# Patient Record
Sex: Female | Born: 1975 | Race: White | Hispanic: No | Smoking: Current every day smoker
Health system: Southern US, Community
[De-identification: ages and names within clinical notes are randomized; demographics above are authoritative.]

## PROBLEM LIST (undated history)

## (undated) DIAGNOSIS — Z91018 Allergy to other foods: Secondary | ICD-10-CM

---

## 2010-05-12 ENCOUNTER — Ambulatory Visit: Payer: Self-pay | Admitting: Family Medicine

## 2011-12-21 ENCOUNTER — Ambulatory Visit: Payer: Self-pay | Admitting: General Practice

## 2012-08-10 ENCOUNTER — Ambulatory Visit: Payer: Self-pay | Admitting: Specialist

## 2012-08-10 LAB — CBC WITH DIFFERENTIAL/PLATELET
Basophil #: 0 10*3/uL (ref 0.0–0.1)
Basophil %: 0.6 %
Eosinophil #: 0.2 10*3/uL (ref 0.0–0.7)
Eosinophil %: 2 %
HCT: 39.2 % (ref 35.0–47.0)
HGB: 13.3 g/dL (ref 12.0–16.0)
Lymphocyte #: 1.6 10*3/uL (ref 1.0–3.6)
Lymphocyte %: 21.3 %
MCH: 30 pg (ref 26.0–34.0)
MCHC: 34 g/dL (ref 32.0–36.0)
MCV: 88 fL (ref 80–100)
Monocyte #: 0.5 x10 3/mm (ref 0.2–0.9)
Monocyte %: 6.3 %
Neutrophil #: 5.4 10*3/uL (ref 1.4–6.5)
Neutrophil %: 69.8 %
Platelet: 299 10*3/uL (ref 150–440)
RBC: 4.43 10*6/uL (ref 3.80–5.20)
RDW: 13.3 % (ref 11.5–14.5)
WBC: 7.7 10*3/uL (ref 3.6–11.0)

## 2012-08-10 LAB — COMPREHENSIVE METABOLIC PANEL
Albumin: 3.1 g/dL — ABNORMAL LOW (ref 3.4–5.0)
Alkaline Phosphatase: 86 U/L (ref 50–136)
Anion Gap: 3 — ABNORMAL LOW (ref 7–16)
BUN: 14 mg/dL (ref 7–18)
Bilirubin,Total: 0.3 mg/dL (ref 0.2–1.0)
Calcium, Total: 8.6 mg/dL (ref 8.5–10.1)
Chloride: 106 mmol/L (ref 98–107)
Co2: 29 mmol/L (ref 21–32)
Creatinine: 0.61 mg/dL (ref 0.60–1.30)
EGFR (African American): >60
EGFR (Non-African Amer.): >60
Glucose: 86 mg/dL (ref 65–99)
Osmolality: 275 (ref 275–301)
Potassium: 4.2 mmol/L (ref 3.5–5.1)
SGOT(AST): 18 U/L (ref 15–37)
SGPT (ALT): 17 U/L (ref 12–78)
Sodium: 138 mmol/L (ref 136–145)
Total Protein: 7.2 g/dL (ref 6.4–8.2)

## 2012-08-10 LAB — IRON AND TIBC
Iron Bind.Cap.(Total): 324 ug/dL (ref 250–450)
Iron Saturation: 21 %
Iron: 69 ug/dL (ref 50–170)
Unbound Iron-Bind.Cap.: 255 ug/dL

## 2012-08-10 LAB — LIPASE, BLOOD: Lipase: 145 U/L (ref 73–393)

## 2012-08-10 LAB — HEMOGLOBIN A1C: Hemoglobin A1C: 5.5 % (ref 4.2–6.3)

## 2012-08-10 LAB — TSH: Thyroid Stimulating Horm: 2.64 u[IU]/mL

## 2012-08-10 LAB — FERRITIN: Ferritin (ARMC): 65 ng/mL (ref 8–388)

## 2012-08-10 LAB — FOLATE: Folic Acid: 17.7 ng/mL (ref 3.1–100.0)

## 2012-08-10 LAB — PHOSPHORUS: Phosphorus: 2.7 mg/dL (ref 2.5–4.9)

## 2012-08-10 LAB — PROTIME-INR
INR: 0.9
Prothrombin Time: 12.8 secs (ref 11.5–14.7)

## 2012-08-10 LAB — AMYLASE: Amylase: 42 U/L (ref 25–115)

## 2012-08-10 LAB — BILIRUBIN, DIRECT: Bilirubin, Direct: <0.1 mg/dL (ref 0.00–0.20)

## 2012-08-10 LAB — MAGNESIUM: Magnesium: 1.4 mg/dL — ABNORMAL LOW

## 2012-08-16 ENCOUNTER — Ambulatory Visit: Payer: Self-pay | Admitting: Specialist

## 2012-09-04 ENCOUNTER — Ambulatory Visit: Payer: Self-pay | Admitting: Specialist

## 2013-03-07 HISTORY — PX: LAPAROSCOPIC GASTRIC SLEEVE RESECTION: SHX5895

## 2013-06-13 DIAGNOSIS — K219 Gastro-esophageal reflux disease without esophagitis: Secondary | ICD-10-CM | POA: Insufficient documentation

## 2014-08-04 ENCOUNTER — Emergency Department
Admission: EM | Admit: 2014-08-04 | Discharge: 2014-08-04 | Disposition: A | Discharge status: Home / Self Care | Payer: BLUE CROSS/BLUE SHIELD | Attending: Emergency Medicine | Admitting: Emergency Medicine

## 2014-08-04 ENCOUNTER — Encounter: Payer: Self-pay | Admitting: Emergency Medicine

## 2014-08-04 DIAGNOSIS — Y9289 Other specified places as the place of occurrence of the external cause: Secondary | ICD-10-CM | POA: Diagnosis not present

## 2014-08-04 DIAGNOSIS — Y9389 Activity, other specified: Secondary | ICD-10-CM | POA: Insufficient documentation

## 2014-08-04 DIAGNOSIS — X58XXXA Exposure to other specified factors, initial encounter: Secondary | ICD-10-CM | POA: Diagnosis not present

## 2014-08-04 DIAGNOSIS — L02415 Cutaneous abscess of right lower limb: Secondary | ICD-10-CM | POA: Diagnosis present

## 2014-08-04 DIAGNOSIS — Y998 Other external cause status: Secondary | ICD-10-CM | POA: Diagnosis not present

## 2014-08-04 DIAGNOSIS — T63301A Toxic effect of unspecified spider venom, accidental (unintentional), initial encounter: Secondary | ICD-10-CM | POA: Insufficient documentation

## 2014-08-04 DIAGNOSIS — Z87891 Personal history of nicotine dependence: Secondary | ICD-10-CM | POA: Diagnosis not present

## 2014-08-04 MED ORDER — SULFAMETHOXAZOLE-TRIMETHOPRIM 800-160 MG PO TABS: 1.0000 | ORAL_TABLET | Freq: Two times a day (BID) | ORAL | Status: DC

## 2014-08-04 MED ORDER — IBUPROFEN 800 MG PO TABS: 800.0000 mg | ORAL_TABLET | Freq: Three times a day (TID) | ORAL | Status: DC | PRN

## 2014-08-04 NOTE — ED Provider Notes (Signed)
[facility] Emergency Department Provider Note  ____________________________________________  Time seen: Approximately 10:32 AM  I have reviewed the triage vital signs and the nursing notes.   HISTORY  Chief Complaint Abscess   HPI Monique Hill is a 39 y.o. female who presents to the emergency room with complaints of a possible spider bite to her right upper thigh. States symptoms started 2 days ago with increased pain and bruising around the site. Denies any other trauma.  No past medical history on file.  There are no active problems to display for this patient.   No past surgical history on file.  Current Outpatient Rx  Name  Route  Sig  Dispense  Refill  . ibuprofen (ADVIL,MOTRIN) 800 MG tablet   Oral   Take 1 tablet (800 mg total) by mouth every 8 (eight) hours as needed.   30 tablet   0   . sulfamethoxazole-trimethoprim (BACTRIM DS,SEPTRA DS) 800-160 MG per tablet   Oral   Take 1 tablet by mouth 2 (two) times daily.   20 tablet   0     Allergies Review of patient's allergies indicates no known allergies.  No family history on file.  Social History History  Substance Use Topics  . Smoking status: Former Games developermoker  . Smokeless tobacco: Not on file  . Alcohol Use: Yes    Review of Systems Constitutional: No fever/chills Eyes: No visual changes. ENT: No sore throat. Cardiovascular: Denies chest pain. Respiratory: Denies shortness of breath. Gastrointestinal: No abdominal pain.  No nausea, no vomiting.  No diarrhea.  No constipation. Genitourinary: Negative for dysuria. Musculoskeletal: Negative for back pain. Skin: Negative for rash. Positive for insect bite Neurological: Negative for headaches, focal weakness or numbness.  10-point ROS otherwise negative.  ____________________________________________   PHYSICAL EXAM:  VITAL SIGNS: ED Triage Vitals  Enc Vitals Group     BP 08/04/14 0841 124/85 mmHg     Pulse Rate  08/04/14 0841 98     Resp 08/04/14 0841 18     Temp 08/04/14 0841 98.7 F (37.1 C)     Temp Source 08/04/14 0841 Oral     SpO2 08/04/14 0841 96 %     Weight 08/04/14 0841 200 lb (90.719 kg)     Height 08/04/14 0841 5\' 5"  (1.651 m)     Head Cir --      Peak Flow --      Pain Score 08/04/14 0838 0     Pain Loc --      Pain Edu? --      Excl. in GC? --     Constitutional: Alert and oriented. Well appearing and in no acute distress. Eyes: Conjunctivae are normal. PERRL. EOMI. Head: Atraumatic. Nose: No congestion/rhinnorhea. Mouth/Throat: Mucous membranes are moist.  Oropharynx non-erythematous. Neck: No stridor.   Cardiovascular: Normal rate, regular rhythm. Grossly normal heart sounds.  Good peripheral circulation. Respiratory: Normal respiratory effort.  No retractions. Lungs CTAB. Gastrointestinal: Soft and nontender. No distention. No abdominal bruits. No CVA tenderness. Musculoskeletal: No lower extremity tenderness nor edema.  No joint effusions. Neurologic:  Normal speech and language. No gross focal neurologic deficits are appreciated. Speech is normal. No gait instability. Skin:  Skin is warm, dry and intact. No rash noted. Positive for 6-8 cm ecchymosis around the central punctate bite. Psychiatric: Mood and affect are normal. Speech and behavior are normal.  ____________________________________________   LABS (all labs ordered are listed, but only abnormal results are displayed)  Labs Reviewed -  No data to display ____________________________________________  EKG  Not applicable ____________________________________________  RADIOLOGY  Not applicable ____________________________________________   PROCEDURES  Procedure(s) performed: None  Critical Care performed: No  ____________________________________________   INITIAL IMPRESSION / ASSESSMENT AND PLAN / ED COURSE  Pertinent labs & imaging results that were available during my care of the patient  were reviewed by me and considered in my medical decision making (see chart for details).  Spider bite/insect bite of unknown origin. Patient understands treatment plan will treat with Bactrim DS twice a day and Motrin 800. Eyes compresses to the area elevate today. Patient understands a bruising will take several weeks to resolve. No other emergency medical complaints at this time ____________________________________________   FINAL CLINICAL IMPRESSION(S) / ED DIAGNOSES  Final diagnoses:  Spider bite, accidental or unintentional, initial encounter      Evangeline Dakin, PA-C 08/04/14 1045  Phineas Semen, MD 08/04/14 1124

## 2014-08-04 NOTE — Discharge Instructions (Signed)
Spider Bite Spider bites are not common. Most spider bites do not cause serious problems. The elderly, very young children, and people with certain existing medical conditions are more likely to experience significant symptoms. SYMPTOMS  Spider bites may not cause any pain at first. Within 1 or 2 days of the bite, there may be swelling, redness, and pain in the bite area. However, some spider bites can cause pain within the first hour. TREATMENT  Your caregiver may prescribe antibiotic medicine if a bacterial infection develops in the bite. However, not all spider bites require antibiotics or prescription medicines.  HOME CARE INSTRUCTIONS  Do not scratch the bite area.  Keep the bite area clean and dry. Wash the area with soap and water as directed.  Put ice or cool compresses on the bite area.  Put ice in a plastic bag.  Place a towel between your skin and the bag.  Leave the ice on for 20 minutes, 4 times a day for the first 2 to 3 days, or as directed.  Keep the bite area elevated above the level of your heart. This helps reduce redness and swelling.  Only take over-the-counter or prescription medicines as directed by your caregiver.  If you are given antibiotics, take them as directed. Finish them even if you start to feel better. You may need a tetanus shot if:  You cannot remember when you had your last tetanus shot.  You have never had a tetanus shot.  The injury broke your skin. If you get a tetanus shot, your arm may swell, get red, and feel warm to the touch. This is common and not a problem. If you need a tetanus shot and you choose not to have one, there is a rare chance of getting tetanus. Sickness from tetanus can be serious. SEEK MEDICAL CARE IF: Your bite is not better after 3 days of treatment. SEEK IMMEDIATE MEDICAL CARE IF:  Your bite turns purple or develops increased swelling, pain, or redness.  You develop shortness of breath or chest pain.  You have  muscle cramps or painful muscle spasms.  You develop abdominal pain, nausea, or vomiting.  You feel unusually tired or sleepy. MAKE SURE YOU:  Understand these instructions.  Will watch your condition.  Will get help right away if you are not doing well or get worse. Document Released: 03/31/2004 Document Revised: 05/16/2011 Document Reviewed: 09/22/2010 ExitCare Patient Information 2015 [employer]. This information is not intended to replace advice given to you by your health care provider. Make sure you discuss any questions you have with your health care provider.  

## 2014-08-04 NOTE — ED Notes (Signed)
C/o abscess to left upper leg since Saturday

## 2017-03-15 DIAGNOSIS — M1712 Unilateral primary osteoarthritis, left knee: Secondary | ICD-10-CM | POA: Insufficient documentation

## 2017-06-22 ENCOUNTER — Emergency Department: Payer: BLUE CROSS/BLUE SHIELD

## 2017-06-22 ENCOUNTER — Emergency Department
Admission: EM | Admit: 2017-06-22 | Discharge: 2017-06-22 | Disposition: A | Discharge status: Home / Self Care | Payer: BLUE CROSS/BLUE SHIELD | Attending: Emergency Medicine | Admitting: Emergency Medicine

## 2017-06-22 ENCOUNTER — Other Ambulatory Visit: Payer: Self-pay

## 2017-06-22 DIAGNOSIS — Z79899 Other long term (current) drug therapy: Secondary | ICD-10-CM | POA: Diagnosis not present

## 2017-06-22 DIAGNOSIS — W108XXA Fall (on) (from) other stairs and steps, initial encounter: Secondary | ICD-10-CM | POA: Insufficient documentation

## 2017-06-22 DIAGNOSIS — Z87891 Personal history of nicotine dependence: Secondary | ICD-10-CM | POA: Insufficient documentation

## 2017-06-22 DIAGNOSIS — M545 Low back pain, unspecified: Secondary | ICD-10-CM

## 2017-06-22 MED ORDER — CYCLOBENZAPRINE HCL 10 MG PO TABS
5.0000 mg | ORAL_TABLET | Freq: Once | ORAL | Status: AC
  Administered 2017-06-22: 5 mg via ORAL
  Filled 2017-06-22: qty 1

## 2017-06-22 MED ORDER — CYCLOBENZAPRINE HCL 10 MG PO TABS: 10.0000 mg | ORAL_TABLET | Freq: Three times a day (TID) | ORAL | 0 refills | Status: DC | PRN

## 2017-06-22 MED ORDER — OXYCODONE-ACETAMINOPHEN 5-325 MG PO TABS
1.0000 | ORAL_TABLET | Freq: Once | ORAL | Status: AC
  Administered 2017-06-22: 1 via ORAL
  Filled 2017-06-22: qty 1

## 2017-06-22 MED ORDER — TRAMADOL HCL 50 MG PO TABS: 50.0000 mg | ORAL_TABLET | Freq: Four times a day (QID) | ORAL | 0 refills | Status: DC | PRN

## 2017-06-22 NOTE — ED Triage Notes (Signed)
Pt states she missed a step and fell landing on her right side today and is having mid to lower right sided back pain..Marland Kitchen

## 2017-06-22 NOTE — Discharge Instructions (Addendum)
Follow-up with your regular doctor or the acute care if not better in 5 to 7 days.  Use the medication as prescribed.  If you are worsening please return to the emergency department.

## 2017-06-22 NOTE — ED Notes (Signed)
Pt states that she was walking out of the house and missed one step and fell down. Pt states that she was holding on to the door and thinks that she twisted her back in the process. Pt states that the pain is a 4/10 while sitting in the chair but when she tries to move her back she becomes a 8/10. Pt states no LOC, no hitting head, and hitting back. Pt states that pain is in middle of her back and her muscles feel as if they are tensing up when she moves.

## 2017-06-22 NOTE — ED Provider Notes (Signed)
[facility] Emergency Department Provider Note  ____________________________________________   First MD Initiated Contact with Patient 06/22/17 1634     (approximate)  I have reviewed the triage vital signs and the nursing notes.   HISTORY  Chief Complaint Fall and Back Pain    HPI Monique Hill is a 42 y.o. female who presents the ER with her sister.  She states that she was walking around the house and missed one step and fell down.  Landed on concrete in the garage.  She states that she was holding onto the door thinks she twisted her back in the process.  She states that the pain is bearable when sitting in the chair but when she tries to move her back becomes extremely painful.  She denies any numbness or tingling going into her legs.  She denies any head injury.  She denies loss of consciousness.  No past medical history on file.  There are no active problems to display for this patient.     Prior to Admission medications   Medication Sig Start Date End Date Taking? Authorizing Provider  cyclobenzaprine (FLEXERIL) 10 MG tablet Take 1 tablet (10 mg total) by mouth 3 (three) times daily as needed for muscle spasms. 06/22/17   Demarques Pilz, Roselyn BeringSusan W, PA-C  ibuprofen (ADVIL,MOTRIN) 800 MG tablet Take 1 tablet (800 mg total) by mouth every 8 (eight) hours as needed. 08/04/14   Beers, Charmayne Sheerharles M, PA-C  sulfamethoxazole-trimethoprim (BACTRIM DS,SEPTRA DS) 800-160 MG per tablet Take 1 tablet by mouth 2 (two) times daily. 08/04/14   Beers, Charmayne Sheerharles M, PA-C  traMADol (ULTRAM) 50 MG tablet Take 1 tablet (50 mg total) by mouth every 6 (six) hours as needed. 06/22/17   Faythe GheeFisher, Lilliana Turner W, PA-C    Allergies Patient has no known allergies.  No family history on file.  Social History Social History   Tobacco Use  . Smoking status: Former Smoker  Substance Use Topics  . Alcohol use: Yes  . Drug use: Not on file    Review of Systems  Constitutional: No  fever/chills Eyes: No visual changes. ENT: No sore throat. Respiratory: Denies cough Genitourinary: Negative for dysuria. Musculoskeletal: Positive for back pain. Skin: Negative for rash.    ____________________________________________   PHYSICAL EXAM:  VITAL SIGNS: ED Triage Vitals  Enc Vitals Group     BP 06/22/17 1634 (!) 128/92     Pulse Rate 06/22/17 1634 98     Resp 06/22/17 1634 17     Temp 06/22/17 1634 98.6 F (37 C)     Temp Source 06/22/17 1634 Oral     SpO2 06/22/17 1634 98 %     Weight 06/22/17 1634 247 lb (112 kg)     Height 06/22/17 1634 5\' 5"  (1.651 m)     Head Circumference --      Peak Flow --      Pain Score 06/22/17 1630 8     Pain Loc --      Pain Edu? --      Excl. in GC? --     Constitutional: Alert and oriented. Well appearing and in no acute distress. Eyes: Conjunctivae are normal.  Head: Atraumatic. Nose: No congestion/rhinnorhea. Mouth/Throat: Mucous membranes are moist.   Cardiovascular: Normal rate, regular rhythm.  Heart sounds are normal Respiratory: Normal respiratory effort.  No retractions, lungs clear to all station GU: deferred Musculoskeletal: Patient is bracing herself at the wheelchair.  She is tender in the lumbar spine.  She  is neurovascularly intact.  She is able to lift her great toes without difficulty. Neurologic:  Normal speech and language.  Skin:  Skin is warm, dry and intact. No rash noted. Psychiatric: Mood and affect are normal. Speech and behavior are normal.  ____________________________________________   LABS (all labs ordered are listed, but only abnormal results are displayed)  Labs Reviewed - No data to display ____________________________________________   ____________________________________________  RADIOLOGY  X-ray of the lumbar spine is negative for any acute abnormality  ____________________________________________   PROCEDURES  Procedure(s) performed: Patient was given Percocet and  Flexeril p.o.  Procedures    ____________________________________________   INITIAL IMPRESSION / ASSESSMENT AND PLAN / ED COURSE  Pertinent labs & imaging results that were available during my care of the patient were reviewed by me and considered in my medical decision making (see chart for details).  Patient is a 42 year old female presents emergency department after falling down one step into the garage.  She landed on concrete.  She is complaining of back pain.  She states that she twisted her back more than falling directly on it.  On physical exam she is bracing herself in the chair.  She appears to be very uncomfortable.  The lumbar spine is tender to palpation.  X-ray lumbar spine was ordered.  Patient was given Percocet p.o. and Flexeril p.o.  X-ray of the lumbar spine was negative.  X-ray results were discussed with the patient.  She states that she does feel much better after the medication.  She was given a prescription for Flexeril and tramadol.  She is to follow-up with her regular doctor if not better in 3 to 5 days.  She is to apply ice to the area as much as possible.  She states she understands will comply with our instructions.  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  She was discharged in stable condition     As part of my medical decision making, I reviewed the following data within the electronic MEDICAL RECORD NUMBER Nursing notes reviewed and incorporated, Old chart reviewed, Radiograph reviewed x-ray lumbar spine is negative, Notes from prior ED visits and [place] Controlled Substance Database  ____________________________________________   FINAL CLINICAL IMPRESSION(S) / ED DIAGNOSES  Final diagnoses:  Acute midline low back pain without sciatica  Fall (on) (from) other stairs and steps, initial encounter      NEW MEDICATIONS STARTED DURING THIS VISIT:  Discharge Medication List as of 06/22/2017  5:54 PM    START taking these  medications   Details  cyclobenzaprine (FLEXERIL) 10 MG tablet Take 1 tablet (10 mg total) by mouth 3 (three) times daily as needed for muscle spasms., Starting Thu 06/22/2017, Print    traMADol (ULTRAM) 50 MG tablet Take 1 tablet (50 mg total) by mouth every 6 (six) hours as needed., Starting Thu 06/22/2017, Print         Note:  This document was prepared using Dragon voice recognition software and may include unintentional dictation errors.    Faythe Ghee, PA-C 06/22/17 2249    Minna Antis, MD 06/22/17 2250

## 2018-05-06 DIAGNOSIS — Z91018 Allergy to other foods: Secondary | ICD-10-CM | POA: Insufficient documentation

## 2018-05-06 DIAGNOSIS — E538 Deficiency of other specified B group vitamins: Secondary | ICD-10-CM | POA: Insufficient documentation

## 2018-05-06 DIAGNOSIS — E559 Vitamin D deficiency, unspecified: Secondary | ICD-10-CM | POA: Insufficient documentation

## 2020-07-06 ENCOUNTER — Other Ambulatory Visit: Payer: Self-pay

## 2020-07-06 ENCOUNTER — Ambulatory Visit
Admission: EM | Admit: 2020-07-06 | Discharge: 2020-07-06 | Disposition: A | Discharge status: Home / Self Care | Payer: BC Managed Care – PPO | Attending: Family Medicine | Admitting: Family Medicine

## 2020-07-06 DIAGNOSIS — R21 Rash and other nonspecific skin eruption: Secondary | ICD-10-CM | POA: Diagnosis not present

## 2020-07-06 HISTORY — DX: Allergy to other foods: Z91.018

## 2020-07-06 MED ORDER — PREDNISONE 10 MG PO TABS: ORAL_TABLET | ORAL | 0 refills | Status: AC

## 2020-07-06 MED ORDER — CETIRIZINE HCL 10 MG PO TABS: 10.0000 mg | ORAL_TABLET | Freq: Every day | ORAL | 1 refills | Status: AC

## 2020-07-06 MED ORDER — HYDROXYZINE HCL 25 MG PO TABS: 25.0000 mg | ORAL_TABLET | Freq: Three times a day (TID) | ORAL | 0 refills | Status: AC | PRN

## 2020-07-06 NOTE — ED Triage Notes (Signed)
Patient states that she has been having a rash since yesterday morning. States that this happened after wearing a nickel necklace. States that rash was minimal but when she woke up this morning it was covering entire face, down right side of neck and on her chest. States that the rash itches and burns and feels tight.

## 2020-07-06 NOTE — Discharge Instructions (Signed)
Medication as prescribed.  If persists, contact dermatology. I recommend [facility] dermatology.  Take care  Dr. Adriana Simas

## 2020-07-06 NOTE — ED Provider Notes (Signed)
[facility]    CSN: [id number] Arrival date & time: 07/06/20  0920      History   Chief Complaint Chief Complaint  Patient presents with  . Rash   HPI  45 year old female presents with the above complaint.  Patient reports that she developed a rash yesterday.  It has now worsened.  She states that the only possible inciting factor that she can think of was wearing a new necklace.  She has rash on the face, neck.  She states that it is extending down to the chest.  The rash itches and burns.  No relieving factors.  No other complaints.  Past Medical History:  Diagnosis Date  . Allergy to alpha-gal    Past Surgical History:  Procedure Laterality Date  . LAPAROSCOPIC GASTRIC SLEEVE RESECTION  2015    OB History   No obstetric history on file.      Home Medications    Prior to Admission medications   Medication Sig Start Date End Date Taking? Authorizing Provider  cetirizine (ZYRTEC ALLERGY) 10 MG tablet Take 1 tablet (10 mg total) by mouth daily. 07/06/20  Yes Ariyannah Pauling G, DO  hydrOXYzine (ATARAX/VISTARIL) 25 MG tablet Take 1 tablet (25 mg total) by mouth every 8 (eight) hours as needed for itching. 07/06/20  Yes Jomayra Novitsky G, DO  omeprazole (PRILOSEC) 40 MG capsule Take 40 mg by mouth daily.   Yes [provider]  predniSONE (DELTASONE) 10 MG tablet 50 mg daily x 2 days, then 40 mg daily x 2 days, then 30 mg daily x 2 days, then 20 mg daily x 2 days, then 10 mg daily x 2 days. 07/06/20  Yes Abelardo Seidner G, DO  sertraline (ZOLOFT) 50 MG tablet Take 1 tablet by mouth daily. 06/24/20   [provider]    Family History Family History  Problem Relation Age of Onset  . Healthy Mother   . Lymphoma Father     Social History Social History   Tobacco Use  . Smoking status: Current Every Day Smoker    Packs/day: 0.50    Types: Cigarettes  . Smokeless tobacco: Never Used  Vaping Use  . Vaping Use: Never used  Substance Use Topics  . Alcohol  use: Yes    Comment: occasionally  . Drug use: Never     Allergies   Alpha-gal and Gluten meal   Review of Systems Review of Systems  Constitutional: Negative.   Skin:       Rash and associated itching.   Physical Exam Triage Vital Signs ED Triage Vitals  Enc Vitals Group     BP 07/06/20 0941 (!) 125/96     Pulse Rate 07/06/20 0941 67     Resp 07/06/20 0941 18     Temp 07/06/20 0941 98.2 F (36.8 C)     Temp Source 07/06/20 0941 Oral     SpO2 07/06/20 0941 97 %     Weight 07/06/20 0937 225 lb (102.1 kg)     Height 07/06/20 0937 5\' 5"  (1.651 m)     Head Circumference --      Peak Flow --      Pain Score 07/06/20 0936 0     Pain Loc --      Pain Edu? --      Excl. in GC? --    No data found.  Updated Vital Signs BP (!) 125/96 (BP Location: Right Arm)   Pulse 67   Temp 98.2  F (36.8 C) (Oral)   Resp 18   Ht 5\' 5"  (1.651 m)   Wt 102.1 kg   LMP 07/06/2020   SpO2 97%   BMI 37.44 kg/m   Visual Acuity Right Eye Distance:   Left Eye Distance:   Bilateral Distance:    Right Eye Near:   Left Eye Near:    Bilateral Near:     Physical Exam Constitutional:      General: She is not in acute distress.    Appearance: She is not ill-appearing.  HENT:     Head: Normocephalic and atraumatic.  Eyes:     General:        Right eye: No discharge.        Left eye: No discharge.     Conjunctiva/sclera: Conjunctivae normal.  Pulmonary:     Effort: Pulmonary effort is normal. No respiratory distress.  Skin:    Comments: Raised, erythematous rash noted on the face, neck.  Neurological:     Mental Status: She is alert.  Psychiatric:        Mood and Affect: Mood normal.        Behavior: Behavior normal.    UC Treatments / Results  Labs (all labs ordered are listed, but only abnormal results are displayed) Labs Reviewed - No data to display  EKG   Radiology No results found.  Procedures Procedures (including critical care time)  Medications Ordered in  UC Medications - No data to display  Initial Impression / Assessment and Plan / UC Course  I have reviewed the triage vital signs and the nursing notes.  Pertinent labs & imaging results that were available during my care of the patient were reviewed by me and considered in my medical decision making (see chart for details).    45 year old female presents with rash.  Appears to be contact or allergic in nature.  Placing on prednisone, Zyrtec, Atarax.  Final Clinical Impressions(s) / UC Diagnoses   Final diagnoses:  Rash     Discharge Instructions     Medication as prescribed.  If persists, contact dermatology. I recommend [facility] dermatology.  Take care  Dr. [facility]    ED Prescriptions    Medication Sig Dispense Auth. Provider   predniSONE (DELTASONE) 10 MG tablet 50 mg daily x 2 days, then 40 mg daily x 2 days, then 30 mg daily x 2 days, then 20 mg daily x 2 days, then 10 mg daily x 2 days. 30 tablet Devika Dragovich G, DO   cetirizine (ZYRTEC ALLERGY) 10 MG tablet Take 1 tablet (10 mg total) by mouth daily. 30 tablet Quention Mcneill G, DO   hydrOXYzine (ATARAX/VISTARIL) 25 MG tablet Take 1 tablet (25 mg total) by mouth every 8 (eight) hours as needed for itching. 30 tablet 04-11-2003, DO     PDMP not reviewed this encounter.   Tommie Sams, Tommie Sams 07/06/20 1025

## 2020-07-28 ENCOUNTER — Other Ambulatory Visit: Payer: Self-pay | Admitting: Family Medicine

## 2021-05-27 ENCOUNTER — Other Ambulatory Visit: Payer: Self-pay | Admitting: Certified Nurse Midwife

## 2021-05-27 DIAGNOSIS — Z1231 Encounter for screening mammogram for malignant neoplasm of breast: Secondary | ICD-10-CM

## 2021-07-01 ENCOUNTER — Ambulatory Visit (INDEPENDENT_AMBULATORY_CARE_PROVIDER_SITE_OTHER): Payer: BC Managed Care – PPO

## 2021-07-01 ENCOUNTER — Ambulatory Visit: Payer: BC Managed Care – PPO | Admitting: Podiatry

## 2021-07-01 DIAGNOSIS — M722 Plantar fascial fibromatosis: Secondary | ICD-10-CM

## 2021-07-01 DIAGNOSIS — E669 Obesity, unspecified: Secondary | ICD-10-CM | POA: Insufficient documentation

## 2021-07-01 DIAGNOSIS — F32A Depression, unspecified: Secondary | ICD-10-CM | POA: Insufficient documentation

## 2021-07-01 NOTE — Progress Notes (Signed)
?Subjective:  ?Patient ID: Monique Hill, female    DOB: [birth year],  MRN: [id number] ? ?Chief Complaint  ?Patient presents with  ? Foot Pain  ? ? ?46 y.o. female presents with the above complaint.  Patient presents with complaint of left heel pain that has been going for quite some time is progressive gotten worse.  Patient states painful to touch painful to walk on.  She would like to discuss treatment options.  She was treated in the past by Dr. Al Corpus for right Planter fasciitis.  She ended up going for surgery for that.  The left side is acting up.  She denies any other acute issues ? ? ?Review of Systems: Negative except as noted in the HPI. Denies N/V/F/Ch. ? ?Past Medical History:  ?Diagnosis Date  ? Allergy to alpha-gal   ? ? ?Current Outpatient Medications:  ?  buPROPion (WELLBUTRIN SR) 150 MG 12 hr tablet, Take by mouth., Disp: , Rfl:  ?  cetirizine (ZYRTEC ALLERGY) 10 MG tablet, Take 1 tablet (10 mg total) by mouth daily., Disp: 30 tablet, Rfl: 1 ?  hydrOXYzine (ATARAX/VISTARIL) 25 MG tablet, Take 1 tablet (25 mg total) by mouth every 8 (eight) hours as needed for itching., Disp: 30 tablet, Rfl: 0 ?  omeprazole (PRILOSEC) 40 MG capsule, Take 40 mg by mouth daily., Disp: , Rfl:  ?  predniSONE (DELTASONE) 10 MG tablet, 50 mg daily x 2 days, then 40 mg daily x 2 days, then 30 mg daily x 2 days, then 20 mg daily x 2 days, then 10 mg daily x 2 days., Disp: 30 tablet, Rfl: 0 ?  Prenatal Multivit-Min-Fe-FA (PRENATAL, W/IRON & FA,) 27-0.8 MG TABS, Take 1 tablet by mouth daily., Disp: , Rfl:  ?  rOPINIRole (REQUIP) 0.5 MG tablet, Take 0.5 mg by mouth at bedtime., Disp: , Rfl:  ?  sertraline (ZOLOFT) 50 MG tablet, Take 1 tablet by mouth daily., Disp: , Rfl:  ? ?Social History  ? ?Tobacco Use  ?Smoking Status Every Day  ? Packs/day: 0.50  ? Types: Cigarettes  ?Smokeless Tobacco Never  ? ? ?Allergies  ?Allergen Reactions  ? Alpha-Gal   ? Beef (Bovine) Protein Diarrhea  ? Gluten Meal   ? ?Objective:  ?There were no  vitals filed for this visit. ?There is no height or weight on file to calculate BMI. ?Constitutional Well developed. ?Well nourished.  ?Vascular Dorsalis pedis pulses palpable bilaterally. ?Posterior tibial pulses palpable bilaterally. ?Capillary refill normal to all digits.  ?No cyanosis or clubbing noted. ?Pedal hair growth normal.  ?Neurologic Normal speech. ?Oriented to person, place, and time. ?Epicritic sensation to light touch grossly present bilaterally.  ?Dermatologic Nails well groomed and normal in appearance. ?No open wounds. ?No skin lesions.  ?Orthopedic: Normal joint ROM without pain or crepitus bilaterally. ?No visible deformities. ?Tender to palpation at the calcaneal tuber left. ?No pain with calcaneal squeeze left. ?Ankle ROM diminished range of motion left. ?Silfverskiold Test: positive left.  ? ?Radiographs: Taken and reviewed. No acute fractures or dislocations. No evidence of stress fracture.  Plantar heel spur present. Posterior heel spur present.  Pes planovalgus foot structure ? ?Assessment:  ? ?1. Plantar fasciitis of left foot   ? ?Plan:  ?Patient was evaluated and treated and all questions answered. ? ?Plantar Fasciitis, left ?- XR reviewed as above.  ?- Educated on icing and stretching. Instructions given.  ?- Injection delivered to the plantar fascia as below. ?- DME: Plantar fascial brace dispensed to support the medial longitudinal  arch of the foot and offload pressure from the heel and prevent arch collapse during weightbearing ?- Pharmacologic management: None ? ?Procedure: Injection Tendon/Ligament ?Location: Left plantar fascia at the glabrous junction; medial approach. ?Skin Prep: alcohol ?Injectate: 0.5 cc 0.5% marcaine plain, 0.5 cc of 1% Lidocaine, 0.5 cc kenalog 10. ?Disposition: Patient tolerated procedure well. Injection site dressed with a band-aid. ? ?No follow-ups on file. ?

## 2021-07-02 ENCOUNTER — Ambulatory Visit
Admission: RE | Admit: 2021-07-02 | Discharge: 2021-07-02 | Disposition: A | Discharge status: Home / Self Care | Payer: BC Managed Care – PPO | Source: Ambulatory Visit | Attending: Certified Nurse Midwife | Admitting: Certified Nurse Midwife

## 2021-07-02 DIAGNOSIS — Z1231 Encounter for screening mammogram for malignant neoplasm of breast: Secondary | ICD-10-CM | POA: Diagnosis present

## 2021-07-29 ENCOUNTER — Ambulatory Visit (INDEPENDENT_AMBULATORY_CARE_PROVIDER_SITE_OTHER): Payer: BC Managed Care – PPO | Admitting: Podiatry

## 2021-07-29 DIAGNOSIS — M722 Plantar fascial fibromatosis: Secondary | ICD-10-CM | POA: Diagnosis not present

## 2021-07-29 DIAGNOSIS — M21862 Other specified acquired deformities of left lower leg: Secondary | ICD-10-CM | POA: Diagnosis not present

## 2021-07-29 DIAGNOSIS — Z01818 Encounter for other preprocedural examination: Secondary | ICD-10-CM

## 2021-08-03 NOTE — Progress Notes (Signed)
Subjective:  Patient ID: Monique Hill, female    DOB: [birth year],  MRN: [id number]  Chief Complaint  Patient presents with   Plantar Fasciitis    46 y.o. female presents with the above complaint.  Patient presents with follow-up of left Planter fasciitis.  She states that this still continues to hurt the injection did not help.  She already had surgery done on the right side and she had failed all conservative treatment options for right side as well.  She would like to discuss surgical options for the left side as conservative treatment options have not been helping.   Review of Systems: Negative except as noted in the HPI. Denies N/V/F/Ch.  Past Medical History:  Diagnosis Date   Allergy to alpha-gal     Current Outpatient Medications:    buPROPion (WELLBUTRIN SR) 150 MG 12 hr tablet, Take by mouth., Disp: , Rfl:    cetirizine (ZYRTEC ALLERGY) 10 MG tablet, Take 1 tablet (10 mg total) by mouth daily., Disp: 30 tablet, Rfl: 1   hydrOXYzine (ATARAX/VISTARIL) 25 MG tablet, Take 1 tablet (25 mg total) by mouth every 8 (eight) hours as needed for itching., Disp: 30 tablet, Rfl: 0   omeprazole (PRILOSEC) 40 MG capsule, Take 40 mg by mouth daily., Disp: , Rfl:    predniSONE (DELTASONE) 10 MG tablet, 50 mg daily x 2 days, then 40 mg daily x 2 days, then 30 mg daily x 2 days, then 20 mg daily x 2 days, then 10 mg daily x 2 days., Disp: 30 tablet, Rfl: 0   Prenatal Multivit-Min-Fe-FA (PRENATAL, W/IRON & FA,) 27-0.8 MG TABS, Take 1 tablet by mouth daily., Disp: , Rfl:    rOPINIRole (REQUIP) 0.5 MG tablet, Take 0.5 mg by mouth at bedtime., Disp: , Rfl:    sertraline (ZOLOFT) 50 MG tablet, Take 1 tablet by mouth daily., Disp: , Rfl:   Social History   Tobacco Use  Smoking Status Every Day   Packs/day: 0.50   Types: Cigarettes  Smokeless Tobacco Never    Allergies  Allergen Reactions   Alpha-Gal    Beef (Bovine) Protein Diarrhea   Gluten Meal    Objective:  There were no vitals filed  for this visit. There is no height or weight on file to calculate BMI. Constitutional Well developed. Well nourished.  Vascular Dorsalis pedis pulses palpable bilaterally. Posterior tibial pulses palpable bilaterally. Capillary refill normal to all digits.  No cyanosis or clubbing noted. Pedal hair growth normal.  Neurologic Normal speech. Oriented to person, place, and time. Epicritic sensation to light touch grossly present bilaterally.  Dermatologic Nails well groomed and normal in appearance. No open wounds. No skin lesions.  Orthopedic: Normal joint ROM without pain or crepitus bilaterally. No visible deformities. Tender to palpation at the calcaneal tuber left. No pain with calcaneal squeeze left. Ankle ROM diminished range of motion left. Silfverskiold Test: positive left.   Radiographs: Taken and reviewed. No acute fractures or dislocations. No evidence of stress fracture.  Plantar heel spur present. Posterior heel spur present.  Pes planovalgus foot structure  Assessment:   1. Plantar fasciitis of left foot     Plan:  Patient was evaluated and treated and all questions answered.  Plantar Fasciitis, left -Clinically the pain has not improved with injection offloading padding shoe gear modification at this time I discussed with the patient that she will benefit from surgical intervention.  I discussed my preoperative intra and postoperative plan in extensive detail.  She would benefit  from left endoscopic plantar fasciotomy with gastrocnemius recession.  She will be weightbearing as tolerated in cam boot after the surgery.  To her comfortability. -Cam boot was dispensed -Informed surgical risk consent was reviewed and read aloud to the patient.  I reviewed the films.  I have discussed my findings with the patient in great detail.  I have discussed all risks including but not limited to infection, stiffness, scarring, limp, disability, deformity, damage to blood vessels and  nerves, numbness, poor healing, need for braces, arthritis, chronic pain, amputation, death.  All benefits and realistic expectations discussed in great detail.  I have made no promises as to the outcome.  I have provided realistic expectations.  I have offered the patient a 2nd opinion, which they have declined and assured me they preferred to proceed despite the risks   No follow-ups on file.

## 2021-08-06 DIAGNOSIS — M79676 Pain in unspecified toe(s): Secondary | ICD-10-CM

## 2021-08-27 ENCOUNTER — Telehealth: Payer: Self-pay | Admitting: Urology

## 2021-09-20 ENCOUNTER — Encounter: Payer: Self-pay | Admitting: Podiatry

## 2021-09-20 ENCOUNTER — Other Ambulatory Visit: Payer: Self-pay | Admitting: Podiatry

## 2021-09-20 DIAGNOSIS — M216X2 Other acquired deformities of left foot: Secondary | ICD-10-CM

## 2021-09-20 DIAGNOSIS — M722 Plantar fascial fibromatosis: Secondary | ICD-10-CM | POA: Diagnosis not present

## 2021-09-20 MED ORDER — OXYCODONE-ACETAMINOPHEN 5-325 MG PO TABS: 1.0000 | ORAL_TABLET | ORAL | 0 refills | Status: AC | PRN

## 2021-09-20 MED ORDER — IBUPROFEN 800 MG PO TABS: 800.0000 mg | ORAL_TABLET | Freq: Four times a day (QID) | ORAL | 1 refills | Status: AC | PRN

## 2021-09-28 ENCOUNTER — Encounter: Payer: Self-pay | Admitting: Podiatry

## 2021-09-28 ENCOUNTER — Ambulatory Visit (INDEPENDENT_AMBULATORY_CARE_PROVIDER_SITE_OTHER): Payer: BC Managed Care – PPO | Admitting: Podiatry

## 2021-09-28 DIAGNOSIS — M722 Plantar fascial fibromatosis: Secondary | ICD-10-CM

## 2021-09-28 DIAGNOSIS — Z9889 Other specified postprocedural states: Secondary | ICD-10-CM

## 2021-09-28 NOTE — Progress Notes (Signed)
  Subjective:  Patient ID: Monique Hill, female    DOB: Jan 27, 1976,  MRN: [id number]  Chief Complaint  Patient presents with   Routine Post Op    POV #1 DOS 09/20/2021 LT EPF W/GASTROCNEMIUS RECESS LT    DOS: 09/20/2021 Procedure: Left plantar fasciectomy with gastrocnemius recession  46 y.o. female returns for post-op check.  Patient states she is doing okay.  She has been using the knee scooter.  She has had a hard time ambulating with a cam boot.  Pain is controlled bandages clean dry and intact  Review of Systems: Negative except as noted in the HPI. Denies N/V/F/Ch.  Past Medical History:  Diagnosis Date   Allergy to alpha-gal     Current Outpatient Medications:    buPROPion (WELLBUTRIN SR) 150 MG 12 hr tablet, Take by mouth., Disp: , Rfl:    cetirizine (ZYRTEC ALLERGY) 10 MG tablet, Take 1 tablet (10 mg total) by mouth daily., Disp: 30 tablet, Rfl: 1   hydrOXYzine (ATARAX/VISTARIL) 25 MG tablet, Take 1 tablet (25 mg total) by mouth every 8 (eight) hours as needed for itching., Disp: 30 tablet, Rfl: 0   ibuprofen (ADVIL) 800 MG tablet, Take 1 tablet (800 mg total) by mouth every 6 (six) hours as needed., Disp: 60 tablet, Rfl: 1   omeprazole (PRILOSEC) 40 MG capsule, Take 40 mg by mouth daily., Disp: , Rfl:    oxyCODONE-acetaminophen (PERCOCET) 5-325 MG tablet, Take 1 tablet by mouth every 4 (four) hours as needed for severe pain., Disp: 30 tablet, Rfl: 0   predniSONE (DELTASONE) 10 MG tablet, 50 mg daily x 2 days, then 40 mg daily x 2 days, then 30 mg daily x 2 days, then 20 mg daily x 2 days, then 10 mg daily x 2 days., Disp: 30 tablet, Rfl: 0   Prenatal Multivit-Min-Fe-FA (PRENATAL, W/IRON & FA,) 27-0.8 MG TABS, Take 1 tablet by mouth daily., Disp: , Rfl:    rOPINIRole (REQUIP) 0.5 MG tablet, Take 0.5 mg by mouth at bedtime., Disp: , Rfl:    sertraline (ZOLOFT) 50 MG tablet, Take 1 tablet by mouth daily., Disp: , Rfl:   Social History   Tobacco Use  Smoking Status Every Day    Packs/day: 0.50   Types: Cigarettes  Smokeless Tobacco Never    Allergies  Allergen Reactions   Alpha-Gal    Beef (Bovine) Protein Diarrhea   Gluten Meal    Objective:  There were no vitals filed for this visit. There is no height or weight on file to calculate BMI. Constitutional Well developed. Well nourished.  Vascular Foot warm and well perfused. Capillary refill normal to all digits.   Neurologic Normal speech. Oriented to person, place, and time. Epicritic sensation to light touch grossly present bilaterally.  Dermatologic Skin healing well without signs of infection. Skin edges well coapted without signs of infection.  Orthopedic: Tenderness to palpation noted about the surgical site.   Radiographs: None Assessment:   1. Plantar fasciitis of left foot   2. Status post foot surgery    Plan:  Patient was evaluated and treated and all questions answered.  S/p foot surgery left -Progressing as expected post-operatively. -XR: See above -WB Status: Weightbearing as tolerated in cam boot -Sutures: Intact.  No clinical signs of Deis is noted.  No complication noted. -Medications: None -Foot redressed.  No follow-ups on file.

## 2021-10-12 ENCOUNTER — Ambulatory Visit (INDEPENDENT_AMBULATORY_CARE_PROVIDER_SITE_OTHER): Payer: BC Managed Care – PPO | Admitting: Podiatry

## 2021-10-12 ENCOUNTER — Encounter: Payer: Self-pay | Admitting: Podiatry

## 2021-10-12 DIAGNOSIS — M722 Plantar fascial fibromatosis: Secondary | ICD-10-CM

## 2021-10-12 DIAGNOSIS — Z9889 Other specified postprocedural states: Secondary | ICD-10-CM

## 2021-10-12 NOTE — Progress Notes (Signed)
  Subjective:  Patient ID: Monique Hill, female    DOB: 08-19-1975,  MRN: [id number]  Chief Complaint  Patient presents with   Routine Post Op    POV #2 DOS 09/20/2021 LT EPF W/GASTROCNEMIUS RECESS LT    DOS: 09/20/2021 Procedure: Left plantar fasciectomy with gastrocnemius recession  46 y.o. female returns for post-op check.  Patient states she is doing okay.  She has been using the knee scooter.  She has been ambulating with the boot on.  She denies any other acute events complaint is much better and does not have much.  Review of Systems: Negative except as noted in the HPI. Denies N/V/F/Ch.  Past Medical History:  Diagnosis Date   Allergy to alpha-gal     Current Outpatient Medications:    buPROPion (WELLBUTRIN SR) 150 MG 12 hr tablet, Take by mouth., Disp: , Rfl:    cetirizine (ZYRTEC ALLERGY) 10 MG tablet, Take 1 tablet (10 mg total) by mouth daily., Disp: 30 tablet, Rfl: 1   hydrOXYzine (ATARAX/VISTARIL) 25 MG tablet, Take 1 tablet (25 mg total) by mouth every 8 (eight) hours as needed for itching., Disp: 30 tablet, Rfl: 0   ibuprofen (ADVIL) 800 MG tablet, Take 1 tablet (800 mg total) by mouth every 6 (six) hours as needed., Disp: 60 tablet, Rfl: 1   omeprazole (PRILOSEC) 40 MG capsule, Take 40 mg by mouth daily., Disp: , Rfl:    oxyCODONE-acetaminophen (PERCOCET) 5-325 MG tablet, Take 1 tablet by mouth every 4 (four) hours as needed for severe pain., Disp: 30 tablet, Rfl: 0   predniSONE (DELTASONE) 10 MG tablet, 50 mg daily x 2 days, then 40 mg daily x 2 days, then 30 mg daily x 2 days, then 20 mg daily x 2 days, then 10 mg daily x 2 days., Disp: 30 tablet, Rfl: 0   Prenatal Multivit-Min-Fe-FA (PRENATAL, W/IRON & FA,) 27-0.8 MG TABS, Take 1 tablet by mouth daily., Disp: , Rfl:    rOPINIRole (REQUIP) 0.5 MG tablet, Take 0.5 mg by mouth at bedtime., Disp: , Rfl:    sertraline (ZOLOFT) 50 MG tablet, Take 1 tablet by mouth daily., Disp: , Rfl:   Social History   Tobacco Use   Smoking Status Every Day   Packs/day: 0.50   Types: Cigarettes  Smokeless Tobacco Never    Allergies  Allergen Reactions   Alpha-Gal    Beef (Bovine) Protein Diarrhea   Gluten Meal    Objective:  There were no vitals filed for this visit. There is no height or weight on file to calculate BMI. Constitutional Well developed. Well nourished.  Vascular Foot warm and well perfused. Capillary refill normal to all digits.   Neurologic Normal speech. Oriented to person, place, and time. Epicritic sensation to light touch grossly present bilaterally.  Dermatologic Skin completely reepithelialized.  No signs of dehiscence noted.  No signs of infection noted.  Good correction alignment noted.  Good range of motion noted of the ankle joint.  No pain on palpation to the calcaneal tuber  Orthopedic: Tenderness to palpation noted about the surgical site.   Radiographs: None Assessment:   No diagnosis found.  Plan:  Patient was evaluated and treated and all questions answered.  S/p foot surgery left -Progressing as expected post-operatively. -XR: See above -WB Status: Weightbearing as tolerated in regular shoes Sutures removed today.  No complication noted no dehiscence noted -Medications: None -Foot redressed.  No follow-ups on file.

## 2021-11-09 ENCOUNTER — Ambulatory Visit: Payer: BC Managed Care – PPO | Admitting: Podiatry

## 2021-11-09 DIAGNOSIS — M722 Plantar fascial fibromatosis: Secondary | ICD-10-CM

## 2021-11-09 DIAGNOSIS — Z9889 Other specified postprocedural states: Secondary | ICD-10-CM

## 2021-11-16 NOTE — Progress Notes (Signed)
  Subjective:  Patient ID: Monique Hill, female    DOB: 23-Jul-1975,  MRN: [id number]  Chief Complaint  Patient presents with   Routine Post Op    DOS: 09/20/2021 Procedure: Left plantar fasciectomy with gastrocnemius recession  46 y.o. female returns for post-op check.  Patient states she is doing okay.  She is doing well.  Minimal complaints.  Denies any other acute complaints.  Review of Systems: Negative except as noted in the HPI. Denies N/V/F/Ch.  Past Medical History:  Diagnosis Date   Allergy to alpha-gal     Current Outpatient Medications:    buPROPion (WELLBUTRIN SR) 150 MG 12 hr tablet, Take by mouth., Disp: , Rfl:    cetirizine (ZYRTEC ALLERGY) 10 MG tablet, Take 1 tablet (10 mg total) by mouth daily., Disp: 30 tablet, Rfl: 1   hydrOXYzine (ATARAX/VISTARIL) 25 MG tablet, Take 1 tablet (25 mg total) by mouth every 8 (eight) hours as needed for itching., Disp: 30 tablet, Rfl: 0   ibuprofen (ADVIL) 800 MG tablet, Take 1 tablet (800 mg total) by mouth every 6 (six) hours as needed., Disp: 60 tablet, Rfl: 1   omeprazole (PRILOSEC) 40 MG capsule, Take 40 mg by mouth daily., Disp: , Rfl:    oxyCODONE-acetaminophen (PERCOCET) 5-325 MG tablet, Take 1 tablet by mouth every 4 (four) hours as needed for severe pain., Disp: 30 tablet, Rfl: 0   predniSONE (DELTASONE) 10 MG tablet, 50 mg daily x 2 days, then 40 mg daily x 2 days, then 30 mg daily x 2 days, then 20 mg daily x 2 days, then 10 mg daily x 2 days., Disp: 30 tablet, Rfl: 0   Prenatal Multivit-Min-Fe-FA (PRENATAL, W/IRON & FA,) 27-0.8 MG TABS, Take 1 tablet by mouth daily., Disp: , Rfl:    rOPINIRole (REQUIP) 0.5 MG tablet, Take 0.5 mg by mouth at bedtime., Disp: , Rfl:    sertraline (ZOLOFT) 50 MG tablet, Take 1 tablet by mouth daily., Disp: , Rfl:   Social History   Tobacco Use  Smoking Status Every Day   Packs/day: 0.50   Types: Cigarettes  Smokeless Tobacco Never    Allergies  Allergen Reactions   Alpha-Gal    Beef  (Bovine) Protein Diarrhea   Gluten Meal    Objective:  There were no vitals filed for this visit. There is no height or weight on file to calculate BMI. Constitutional Well developed. Well nourished.  Vascular Foot warm and well perfused. Capillary refill normal to all digits.   Neurologic Normal speech. Oriented to person, place, and time. Epicritic sensation to light touch grossly present bilaterally.  Dermatologic Skin completely reepithelialized.  No signs of dehiscence noted.  No signs of infection noted.  Good correction alignment noted.  Good range of motion noted of the ankle joint.  No pain on palpation to the calcaneal tuber  Orthopedic: Tenderness to palpation noted about the surgical site.   Radiographs: None Assessment:   1. Status post foot surgery   2. Plantar fasciitis of left foot     Plan:  Patient was evaluated and treated and all questions answered.  S/p foot surgery left -Plan Ackley healed and officially discharged from my standpoint.  At this time I discussed prevention and shoe gear modification sheet.  If any foot and ankle issues arise in the future of asked her to come back and see me.  She states understanding.  No follow-ups on file.

## 2023-08-30 IMAGING — MG MM DIGITAL SCREENING BILAT W/ TOMO AND CAD
6 of 12 series · 6 of 36 positions shown · non-contrast
Comparison: None.

CLINICAL DATA: Screening.

EXAM:
DIGITAL SCREENING BILATERAL MAMMOGRAM WITH TOMOSYNTHESIS AND CAD
TECHNIQUE: Bilateral screening digital craniocaudal and mediolateral oblique
mammograms were obtained. Bilateral screening digital breast
tomosynthesis was performed. The images were evaluated with
computer-aided detection.

[L MLO synth-2D (1 of 2)]
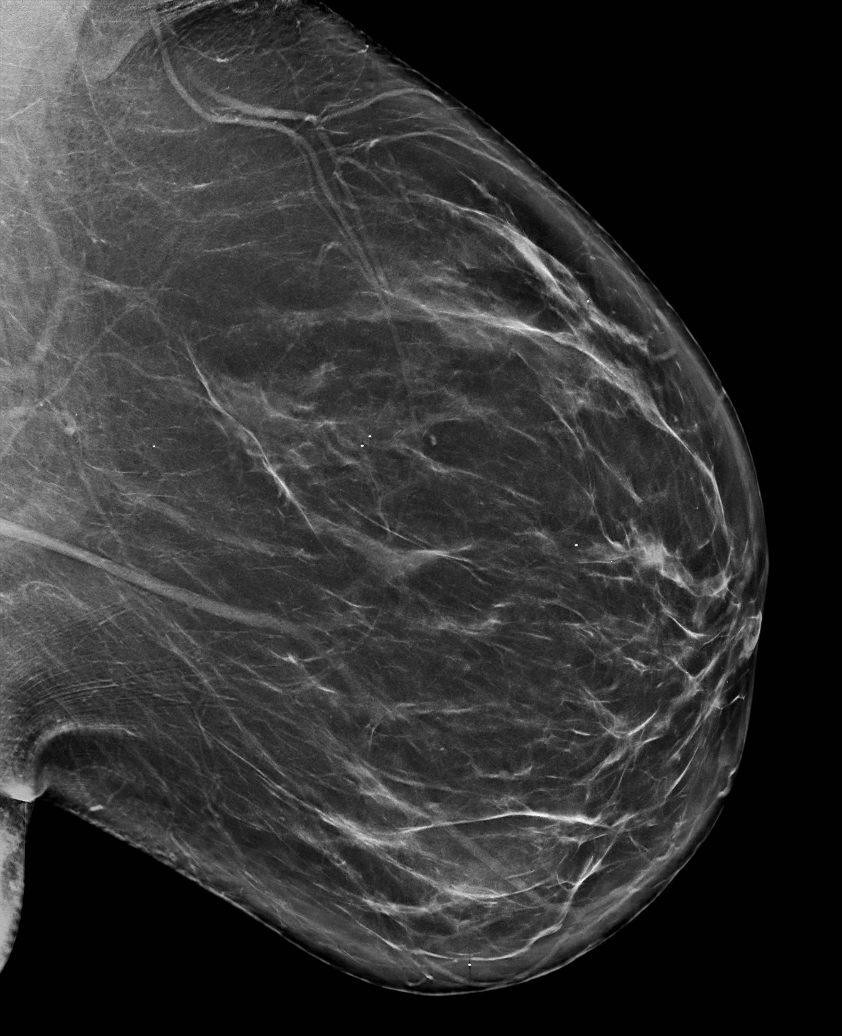

[L MLO synth-2D (2 of 2)]
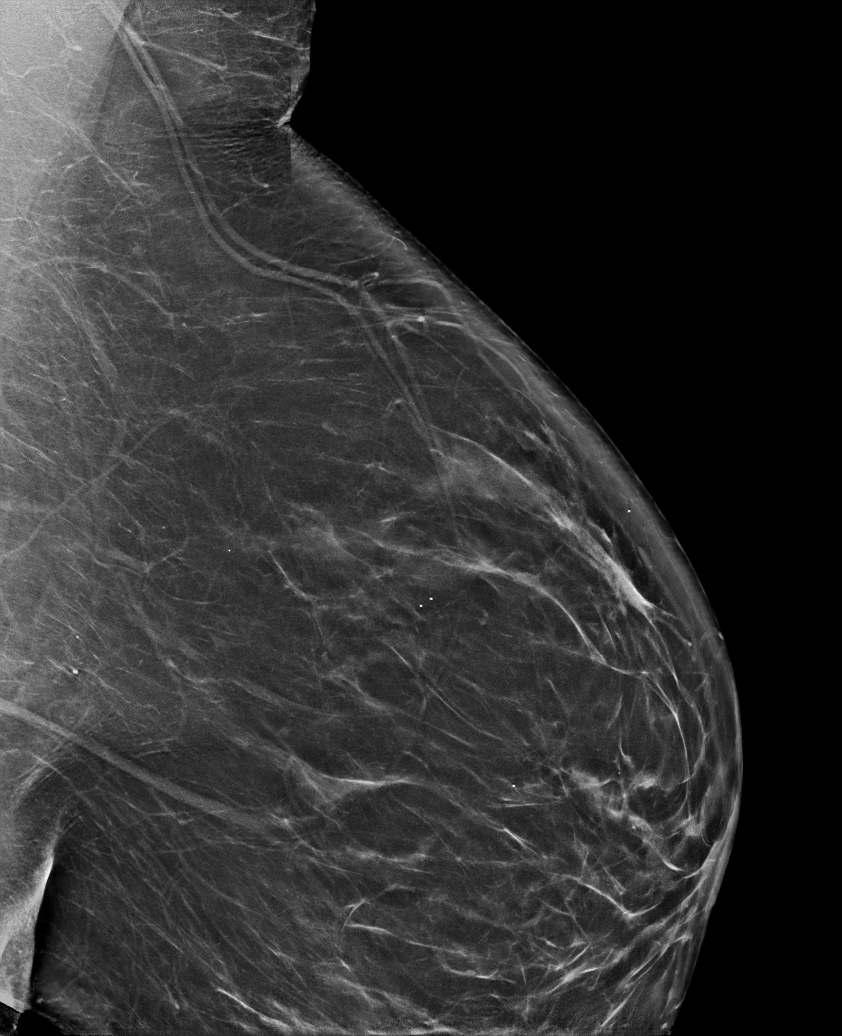

[R MLO synth-2D (1 of 2)]
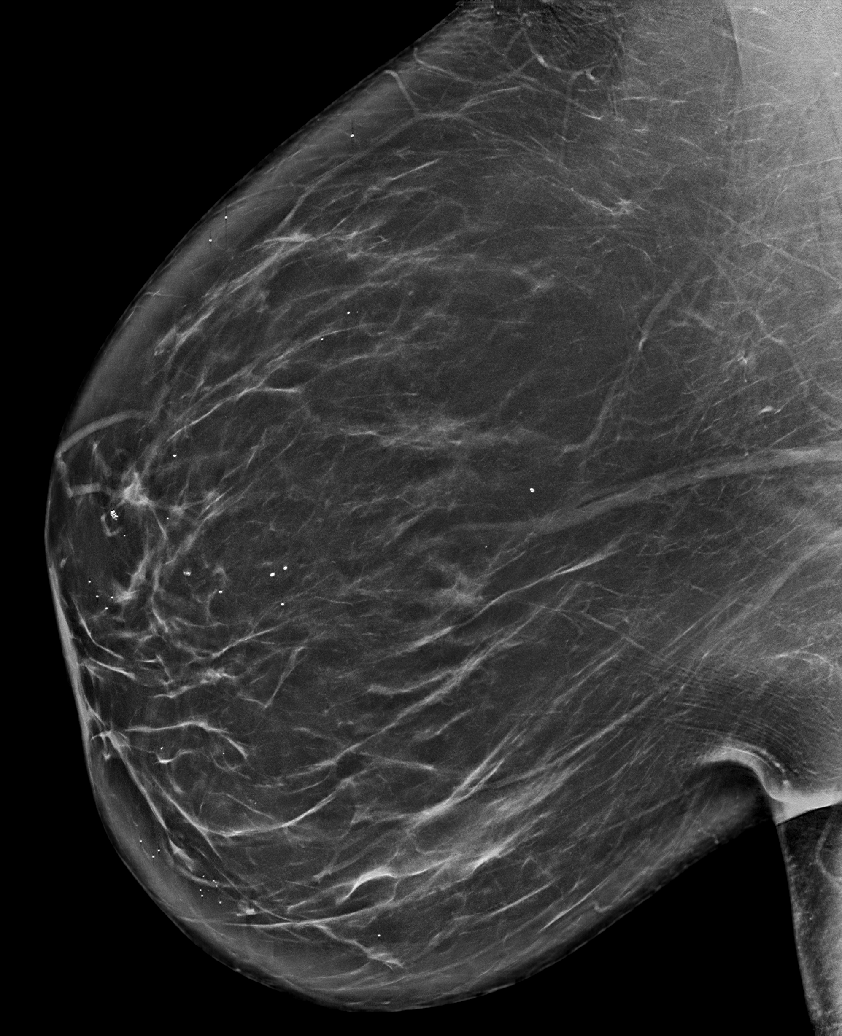

[R CC synth-2D]
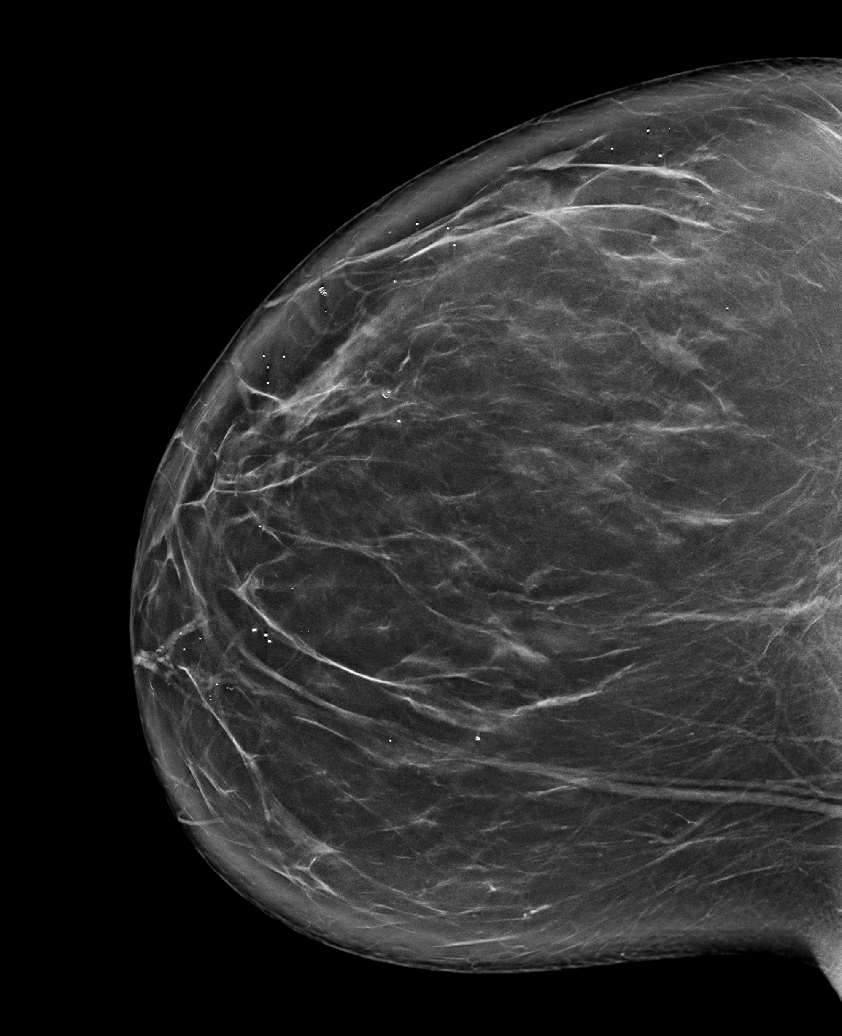

[R MLO synth-2D (2 of 2)]
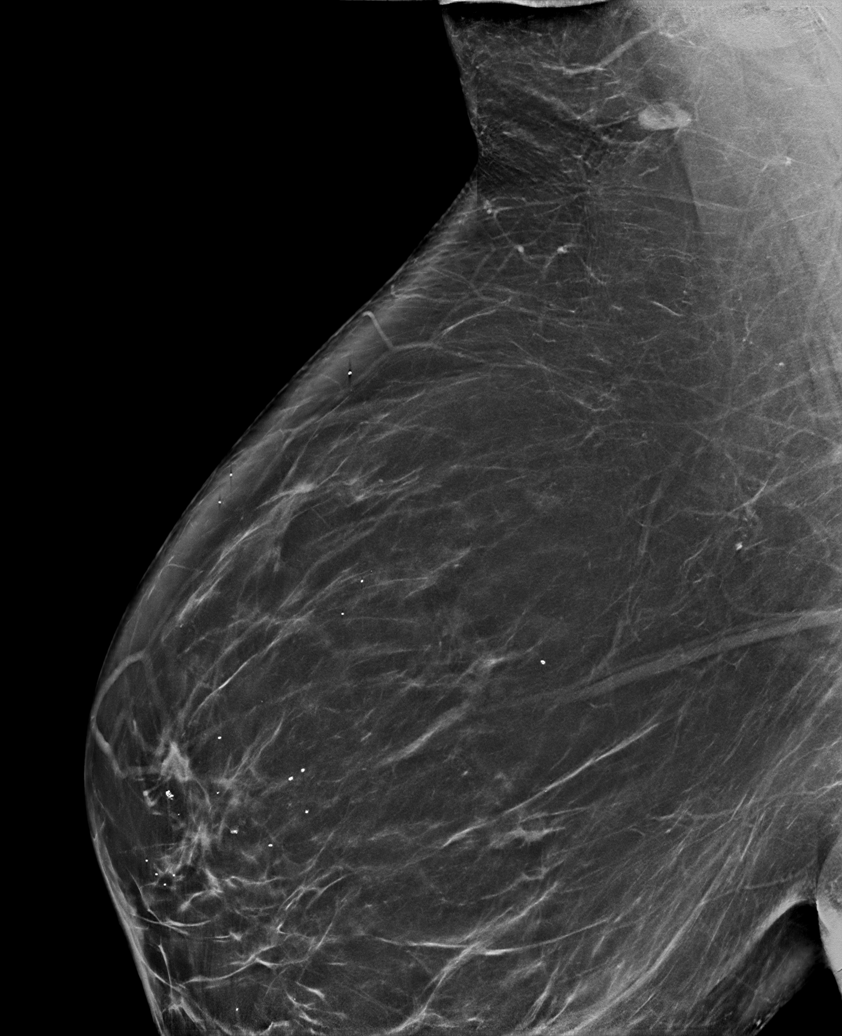

[L CC synth-2D]
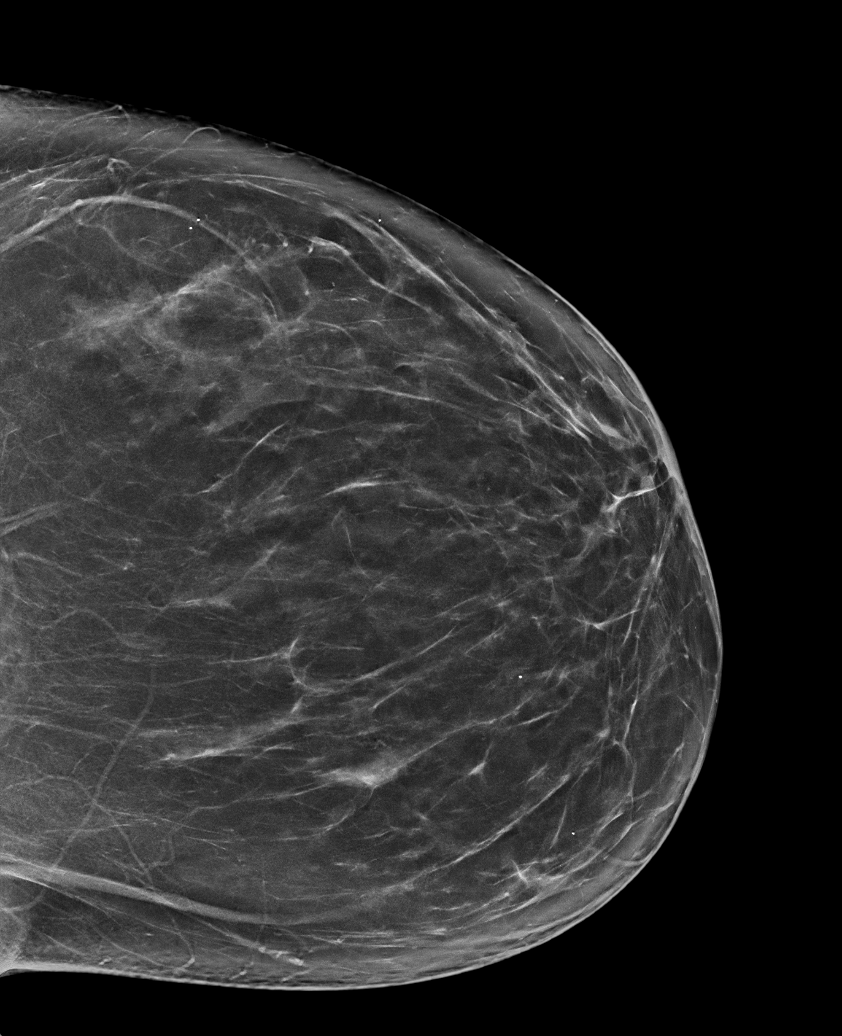

[6 of 36 positions shown; findings below may reference images not displayed]

ACR Breast Density Category b: There are scattered areas of
fibroglandular density.
FINDINGS: There are no findings suspicious for malignancy.
IMPRESSION: No mammographic evidence of malignancy. A result letter of this
screening mammogram will be mailed directly to the patient.

RECOMMENDATION:
Screening mammogram in one year. (Code:XG-X-X7B)

BI-RADS CATEGORY  1: Negative.

## 2024-01-18 ENCOUNTER — Encounter: Payer: Self-pay | Admitting: Nurse Practitioner
